# Patient Record
Sex: Female | Born: 1980 | Race: Black or African American | Hispanic: No | Marital: Single | Smoking: Current every day smoker
Health system: Southern US, Community
[De-identification: ages and names within clinical notes are randomized; demographics above are authoritative.]

## PROBLEM LIST (undated history)

## (undated) DIAGNOSIS — I10 Essential (primary) hypertension: Secondary | ICD-10-CM

---

## 2013-06-17 ENCOUNTER — Encounter (HOSPITAL_BASED_OUTPATIENT_CLINIC_OR_DEPARTMENT_OTHER): Payer: Self-pay | Admitting: Emergency Medicine

## 2013-06-17 ENCOUNTER — Emergency Department (HOSPITAL_BASED_OUTPATIENT_CLINIC_OR_DEPARTMENT_OTHER)
Admission: EM | Admit: 2013-06-17 | Discharge: 2013-06-17 | Discharge status: Against Medical Advice | Payer: Self-pay | Attending: Emergency Medicine | Admitting: Emergency Medicine

## 2013-06-17 ENCOUNTER — Emergency Department (HOSPITAL_BASED_OUTPATIENT_CLINIC_OR_DEPARTMENT_OTHER): Payer: Self-pay

## 2013-06-17 ENCOUNTER — Emergency Department (HOSPITAL_BASED_OUTPATIENT_CLINIC_OR_DEPARTMENT_OTHER)
Admission: EM | Admit: 2013-06-17 | Discharge: 2013-06-17 | Disposition: A | Discharge status: Home / Self Care | Payer: Self-pay | Attending: Emergency Medicine | Admitting: Emergency Medicine

## 2013-06-17 DIAGNOSIS — R51 Headache: Secondary | ICD-10-CM | POA: Insufficient documentation

## 2013-06-17 DIAGNOSIS — N390 Urinary tract infection, site not specified: Secondary | ICD-10-CM | POA: Insufficient documentation

## 2013-06-17 DIAGNOSIS — R350 Frequency of micturition: Secondary | ICD-10-CM | POA: Insufficient documentation

## 2013-06-17 DIAGNOSIS — O2390 Unspecified genitourinary tract infection in pregnancy, unspecified trimester: Secondary | ICD-10-CM | POA: Insufficient documentation

## 2013-06-17 DIAGNOSIS — O99330 Smoking (tobacco) complicating pregnancy, unspecified trimester: Secondary | ICD-10-CM | POA: Insufficient documentation

## 2013-06-17 DIAGNOSIS — Z3490 Encounter for supervision of normal pregnancy, unspecified, unspecified trimester: Secondary | ICD-10-CM

## 2013-06-17 DIAGNOSIS — R109 Unspecified abdominal pain: Secondary | ICD-10-CM | POA: Insufficient documentation

## 2013-06-17 LAB — CBC WITH DIFFERENTIAL/PLATELET
Basophils Absolute: 0 10*3/uL (ref 0.0–0.1)
Basophils Relative: 0 % (ref 0–1)
EOS ABS: 0.1 10*3/uL (ref 0.0–0.7)
Eosinophils Relative: 1 % (ref 0–5)
HCT: 33.8 % — ABNORMAL LOW (ref 36.0–46.0)
HEMOGLOBIN: 11.3 g/dL — AB (ref 12.0–15.0)
LYMPHS ABS: 3.2 10*3/uL (ref 0.7–4.0)
LYMPHS PCT: 37 % (ref 12–46)
MCH: 28 pg (ref 26.0–34.0)
MCHC: 33.4 g/dL (ref 30.0–36.0)
MCV: 83.7 fL (ref 78.0–100.0)
MONOS PCT: 8 % (ref 3–12)
Monocytes Absolute: 0.7 10*3/uL (ref 0.1–1.0)
NEUTROS PCT: 54 % (ref 43–77)
Neutro Abs: 4.7 10*3/uL (ref 1.7–7.7)
Platelets: 281 10*3/uL (ref 150–400)
RBC: 4.04 MIL/uL (ref 3.87–5.11)
RDW: 13.8 % (ref 11.5–15.5)
WBC: 8.6 10*3/uL (ref 4.0–10.5)

## 2013-06-17 LAB — PREGNANCY, URINE: PREG TEST UR: POSITIVE — AB

## 2013-06-17 LAB — URINALYSIS, ROUTINE W REFLEX MICROSCOPIC
Bilirubin Urine: NEGATIVE
Glucose, UA: NEGATIVE mg/dL
HGB URINE DIPSTICK: NEGATIVE
Ketones, ur: NEGATIVE mg/dL
Nitrite: POSITIVE — AB
PROTEIN: NEGATIVE mg/dL
Specific Gravity, Urine: 1.019 (ref 1.005–1.030)
Urobilinogen, UA: 1 mg/dL (ref 0.0–1.0)
pH: 7 (ref 5.0–8.0)

## 2013-06-17 LAB — ABO/RH: ABO/RH(D): O POS

## 2013-06-17 LAB — URINE MICROSCOPIC-ADD ON

## 2013-06-17 LAB — HCG, QUANTITATIVE, PREGNANCY: HCG, BETA CHAIN, QUANT, S: 40120 m[IU]/mL — AB (ref <5)

## 2013-06-17 MED ORDER — CEPHALEXIN 500 MG PO CAPS: 500.0000 mg | ORAL_CAPSULE | Freq: Four times a day (QID) | ORAL | Status: AC

## 2013-06-17 NOTE — ED Notes (Signed)
Pt states she has to pick up her sister by 8pm, she can't stay for US or blood work; pt states she will come back afterwards. EDP notified; pt encouraged to stay, no distress noted

## 2013-06-17 NOTE — ED Notes (Signed)
Pt c/o urinary freq and left flank pain

## 2013-06-17 NOTE — ED Provider Notes (Signed)
CSN: [id number]     Arrival date & time 06/17/13  2020 History   First MD Initiated Contact with Patient 06/17/13 2029     Chief Complaint  Patient presents with  . Abdominal Pain     (Consider location/radiation/quality/duration/timing/severity/associated sxs/prior Treatment) HPI Comments: Patient was here Approximately an hour ago and left to pick up her sister. She had signed out AMA after I told her she was pregnant and wanted to rule out an ectopic pregnancy. She returns for completion of her workup.  Patient is a 33 y.o. female presenting with abdominal pain. The history is provided by the patient.  Abdominal Pain Pain location:  L flank Pain quality: cramping   Pain radiates to:  Does not radiate Pain severity:  Moderate Onset quality:  Gradual Duration:  2 days Timing:  Constant Progression:  Worsening Chronicity:  New   History reviewed. No pertinent past medical history. Past Surgical History  Procedure Laterality Date  . Cesarean section     History reviewed. No pertinent family history. History  Substance Use Topics  . Smoking status: Current Every Day Smoker -- 0.50 packs/day    Types: Cigarettes  . Smokeless tobacco: Not on file  . Alcohol Use: No   OB History   Grav Para Term Preterm Abortions TAB SAB Ect Mult Living                 Review of Systems  Gastrointestinal: Positive for abdominal pain.  All other systems reviewed and are negative.     Allergies  Review of patient's allergies indicates no known allergies.  Home Medications   Prior to Admission medications   Medication Sig Start Date End Date Taking? Authorizing Provider  cephALEXin (KEFLEX) 500 MG capsule Take 1 capsule (500 mg total) by mouth 4 (four) times daily. 06/17/13   Geoffery Lyons, MD   BP 138/85  Pulse 85  Temp(Src) 99.3 F (37.4 C) (Oral)  Resp 16  SpO2 100%  LMP 04/30/2013 Physical Exam  Nursing note and vitals reviewed. Constitutional: She is oriented to person,  place, and time. She appears well-developed and well-nourished. No distress.  HENT:  Head: Normocephalic and atraumatic.  Neck: Normal range of motion. Neck supple.  Cardiovascular: Normal rate and regular rhythm.  Exam reveals no gallop and no friction rub.   No murmur heard. Pulmonary/Chest: Effort normal and breath sounds normal. No respiratory distress. She has no wheezes.  Abdominal: Soft. Bowel sounds are normal. She exhibits no distension. There is no tenderness.  Musculoskeletal: Normal range of motion.  Neurological: She is alert and oriented to person, place, and time.  Skin: Skin is warm and dry. She is not diaphoretic.    ED Course  Procedures (including critical care time) Labs Review Labs Reviewed  CBC WITH DIFFERENTIAL - Abnormal; Notable for the following:    Hemoglobin 11.3 (*)    HCT 33.8 (*)    All other components within normal limits  HCG, QUANTITATIVE, PREGNANCY - Abnormal; Notable for the following:    hCG, Beta Chain, Quant, S 40120 (*)    All other components within normal limits  ABO/RH    Imaging Review US Ob Comp Less 14 Wks  06/17/2013   CLINICAL DATA:  Increased urinary frequency. Left pelvic and flank pain.  EXAM: OBSTETRIC <14 WK [place] AND TRANSVAGINAL OB US  TECHNIQUE: Both transabdominal and transvaginal ultrasound examinations were performed for complete evaluation of the gestation as well as the maternal uterus, adnexal regions, and pelvic cul-de-sac.  Transvaginal technique was performed to assess early pregnancy.  COMPARISON:  None.  FINDINGS: Intrauterine gestational sac: Visualized/normal in shape.  Yolk sac:  Yes  Embryo:  Yes  Cardiac Activity: Yes  Heart Rate: 168 bpm  CRL:   4.2  cm   11 w 0 d                  US EDC: 01/06/2014  Maternal uterus/adnexae: No significant subchorionic hemorrhage is seen. There is question of an amniotic band. The uterus is otherwise unremarkable in appearance.  The ovaries are unremarkable in appearance. The right  ovary measures 3.1 x 2.5 x 2.0 cm, while the left ovary measures 3.8 x 2.0 x 2.3 cm. No suspicious adnexal masses are seen; there is no evidence for ovarian torsion.  No free fluid is seen within the pelvic cul-de-sac.  IMPRESSION: 1. Single live intrauterine pregnancy noted, with a crown-rump length of 4.2 cm, corresponding to a gestational age of [redacted] weeks 0 days. This does not match the gestational age by LMP, and reflects a new estimated date of delivery of January 06, 2014. 2. Question of an amniotic band. Follow-up complete OB ultrasound would be indicated as the fetus grows, to better assess this potential finding.   Electronically Signed   By: Roanna RaiderJeffery  Chang M.D.   On: 06/17/2013 22:35   [place] Ob Transvaginal  06/17/2013   CLINICAL DATA:  Increased urinary frequency. Left pelvic and flank pain.  EXAM: OBSTETRIC <14 WK US AND TRANSVAGINAL OB US  TECHNIQUE: Both transabdominal and transvaginal ultrasound examinations were performed for complete evaluation of the gestation as well as the maternal uterus, adnexal regions, and pelvic cul-de-sac. Transvaginal technique was performed to assess early pregnancy.  COMPARISON:  None.  FINDINGS: Intrauterine gestational sac: Visualized/normal in shape.  Yolk sac:  Yes  Embryo:  Yes  Cardiac Activity: Yes  Heart Rate: 168 bpm  CRL:   4.2  cm   11 w 0 d                  US EDC: 01/06/2014  Maternal uterus/adnexae: No significant subchorionic hemorrhage is seen. There is question of an amniotic band. The uterus is otherwise unremarkable in appearance.  The ovaries are unremarkable in appearance. The right ovary measures 3.1 x 2.5 x 2.0 cm, while the left ovary measures 3.8 x 2.0 x 2.3 cm. No suspicious adnexal masses are seen; there is no evidence for ovarian torsion.  No free fluid is seen within the pelvic cul-de-sac.  IMPRESSION: 1. Single live intrauterine pregnancy noted, with a crown-rump length of 4.2 cm, corresponding to a gestational age of [redacted] weeks 0 days. This  does not match the gestational age by LMP, and reflects a new estimated date of delivery of January 06, 2014. 2. Question of an amniotic band. Follow-up complete OB ultrasound would be indicated as the fetus grows, to better assess this potential finding.   Electronically Signed   By: Roanna RaiderJeffery  Chang M.D.   On: 06/17/2013 22:35     EKG Interpretation None      MDM   Final diagnoses:  UTI (urinary tract infection)  Pregnant    Beta hCG and ultrasound are consistent with an [redacted] week gestation. There is no ectopic pregnancy and no other acute abnormality. She is feeling better and is requesting discharge. She will be prescribed Keflex and understands to return if she develops vaginal bleeding, increasing pain, high fever, vomiting, or any other new and concerning  symptoms.     [name] Shaurya Rawdon, MD 06/17/13 [phone]

## 2013-06-17 NOTE — ED Notes (Signed)
Seen here earlier for same

## 2013-06-17 NOTE — Discharge Instructions (Signed)
Keflex as prescribed.  Followup with OB for further care.   Urinary Tract Infection Urinary tract infections (UTIs) can develop anywhere along your urinary tract. Your urinary tract is your body's drainage system for removing wastes and extra water. Your urinary tract includes two kidneys, two ureters, a bladder, and a urethra. Your kidneys are a pair of bean-shaped organs. Each kidney is about the size of your fist. They are located below your ribs, one on each side of your spine. CAUSES Infections are caused by microbes, which are microscopic organisms, including fungi, viruses, and bacteria. These organisms are so small that they can only be seen through a microscope. Bacteria are the microbes that most commonly cause UTIs. SYMPTOMS  Symptoms of UTIs may vary by age and gender of the patient and by the location of the infection. Symptoms in young women typically include a frequent and intense urge to urinate and a painful, burning feeling in the bladder or urethra during urination. Older women and men are more likely to be tired, shaky, and weak and have muscle aches and abdominal pain. A fever may mean the infection is in your kidneys. Other symptoms of a kidney infection include pain in your back or sides below the ribs, nausea, and vomiting. DIAGNOSIS To diagnose a UTI, your caregiver will ask you about your symptoms. Your caregiver also will ask to provide a urine sample. The urine sample will be tested for bacteria and white blood cells. White blood cells are made by your body to help fight infection. TREATMENT  Typically, UTIs can be treated with medication. Because most UTIs are caused by a bacterial infection, they usually can be treated with the use of antibiotics. The choice of antibiotic and length of treatment depend on your symptoms and the type of bacteria causing your infection. HOME CARE INSTRUCTIONS  If you were prescribed antibiotics, take them exactly as your caregiver  instructs you. Finish the medication even if you feel better after you have only taken some of the medication.  Drink enough water and fluids to keep your urine clear or pale yellow.  Avoid caffeine, tea, and carbonated beverages. They tend to irritate your bladder.  Empty your bladder often. Avoid holding urine for long periods of time.  Empty your bladder before and after sexual intercourse.  After a bowel movement, women should cleanse from front to back. Use each tissue only once. SEEK MEDICAL CARE IF:   You have back pain.  You develop a fever.  Your symptoms do not begin to resolve within 3 days. SEEK IMMEDIATE MEDICAL CARE IF:   You have severe back pain or lower abdominal pain.  You develop chills.  You have nausea or vomiting.  You have continued burning or discomfort with urination. MAKE SURE YOU:   Understand these instructions.  Will watch your condition.  Will get help right away if you are not doing well or get worse. Document Released: 11/15/2004 Document Revised: 08/07/2011 Document Reviewed: 03/16/2011 [facility] Patient Information 2014 [place], [place].  Pregnancy If you are planning on getting pregnant, it is a good idea to make a preconception appointment with your caregiver to discuss having a healthy lifestyle before getting pregnant. This includes diet, weight, exercise, taking prenatal vitamins (especially folic acid, which helps prevent brain and spinal cord defects), avoiding alcohol, smoking and illegal drugs, medical problems (diabetes, convulsions), family history of genetic problems, working conditions, and immunizations. It is better to have knowledge of these things and do something about them before  getting pregnant. During your pregnancy, it is important to follow certain guidelines in order to have a healthy baby. It is very important to get good prenatal care and follow your caregiver's instructions. Prenatal care includes all the medical care  you receive before your baby's birth. This helps to prevent problems during the pregnancy and childbirth. HOME CARE INSTRUCTIONS   Start your prenatal visits by the 12th week of pregnancy or earlier, if possible. At first, appointments are usually scheduled monthly. They become more frequent in the last 2 months before delivery. It is important that you keep your caregiver's appointments and follow your caregiver's instructions regarding medication use, exercise, and diet.  During pregnancy, you are providing food for you and your baby. Eat a regular, well-balanced diet. Choose foods such as meat, fish, milk and other dairy products, vegetables, fruits, whole-grain breads and cereals. Your caregiver will inform you of the ideal weight gain depending on your current height and weight. Drink lots of liquids. Try to drink 8 glasses of water a day.  Alcohol is associated with a number of birth defects including fetal alcohol syndrome. It is best to avoid alcohol completely. Smoking will cause low birth rate and prematurity. Use of alcohol and nicotine during your pregnancy also increases the chances that your child will be chemically dependent later in their life and may contribute to SIDS (Sudden Infant Death Syndrome).  Do not use illegal drugs.  Only take prescription or over-the-counter medications that are recommended by your caregiver. Other medications can cause genetic and physical problems in the baby.  Morning sickness can often be helped by keeping soda crackers at the bedside. Eat a few before getting up in the morning.  A sexual relationship may be continued until near the end of pregnancy if there are no other problems such as early (premature) leaking of amniotic fluid from the membranes, vaginal bleeding, painful intercourse or belly (abdominal) pain.  Exercise regularly. Check with your caregiver if you are unsure of the safety of some of your exercises.  Do not use hot tubs, steam  rooms or saunas. These increase the risk of fainting and hurting yourself and the baby. Swimming is OK for exercise. Get plenty of rest, including afternoon naps when possible, especially in the third trimester.  Avoid toxic odors and chemicals.  Do not wear high heels. They may cause you to lose your balance and fall.  Do not lift over 5 pounds. If you do lift anything, lift with your legs and thighs, not your back.  Avoid long trips, especially in the third trimester.  If you have to travel out of the city or state, take a copy of your medical records with you. SEEK IMMEDIATE MEDICAL CARE IF:   You develop an unexplained oral temperature above 102 F (38.9 C), or as your caregiver suggests.  You have leaking of fluid from the vagina. If leaking membranes are suspected, take your temperature and inform your caregiver of this when you call.  There is vaginal spotting or bleeding. Notify your caregiver of the amount and how many pads are used.  You continue to feel sick to your stomach (nauseous) and have no relief from remedies suggested, or you throw up (vomit) blood or coffee ground like materials.  You develop upper abdominal pain.  You have round ligament discomfort in the lower abdominal area. This still must be evaluated by your caregiver.  You feel contractions of the uterus.  You do not feel the baby move,  or there is less movement than before.  You have painful urination.  You have abnormal vaginal discharge.  You have persistent diarrhea.  You get a severe headache.  You have problems with your vision.  You develop muscle weakness.  You feel dizzy and faint.  You develop shortness of breath.  You develop chest pain.  You have back pain that travels down to your leg and feet.  You feel irregular or a very fast heartbeat.  You develop excessive weight gain in a short period of time (5 pounds in 3 to 5 days).  You are involved in a domestic violence  situation. Document Released: 02/05/2005 Document Revised: 08/07/2011 Document Reviewed: 07/30/2008 [facility] Patient Information 2014 [place], [place].

## 2013-06-17 NOTE — ED Notes (Signed)
Pt c/o abd pain and ha,  Was seen here earlier this pm  haad to [place] and now has returned

## 2013-06-17 NOTE — ED Provider Notes (Signed)
CSN: [id number]     Arrival date & time 06/17/13  1846 History  This chart was scribed for Natalie Mckenzie [name], MD by Luisa DagoPriscilla Tutu, ED Scribe. This patient was seen in room MH05/MH05 and the patient's care was started at 7:18 PM.    Chief Complaint  Patient presents with  . Urinary Frequency    The history is provided by the patient. No language interpreter was used.   HPI Comments: Natalie Mckenzie is a 33 y.o. female who presents to the Emergency Department complaining of constant headaches that started . Pt is also complaining of left sided cramping and a "strong odor" to her urine. 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 Pt states that she no longer has normal periods since the birth of her son in 2013. She states that they had to do an emergency C-section and there were some complications that effected her uterus. Pt is sexually active. She denies any pertinent medical history. She denies any hematuria.  Pt's has type O blood. She does not recall if she is Rh positive or negative.   History reviewed. No pertinent past medical history. Past Surgical History  Procedure Laterality Date  . Cesarean section     History reviewed. No pertinent family history. History  Substance Use Topics  . Smoking status: Current Every Day Smoker -- 0.50 packs/day    Types: Cigarettes  . Smokeless tobacco: Not on file  . Alcohol Use: No   OB History   Grav Para Term Preterm Abortions TAB SAB Ect Mult Living                 Review of Systems A complete 10 system review of systems was obtained and all systems are negative except as noted in the HPI and PMH.     Allergies  Review of patient's allergies indicates no known allergies.  Home Medications   Prior to Admission medications   Not on File   BP 133/74  Pulse 77  Temp(Src) 98.9 F (37.2 C) (Oral)  Resp 16  Ht 5\' 4"  (1.626 m)  Wt 200 lb (90.719 kg)   BMI 34.31 kg/m2  SpO2 100%  LMP 04/30/2013  Physical Exam  Nursing note and vitals reviewed. Constitutional: She is oriented to person, place, and time. She appears well-developed and well-nourished. No distress.  HENT:  Head: Normocephalic and atraumatic.  Right Ear: External ear normal.  Left Ear: External ear normal.  Nose: Nose normal.  Mouth/Throat: Oropharynx is clear and moist. No oropharyngeal exudate.  Eyes: Conjunctivae and EOM are normal. Pupils are equal, round, and reactive to light. Right eye exhibits no discharge. Left eye exhibits no discharge.  Neck: Normal range of motion. Neck supple. No thyromegaly present.  Cardiovascular: Normal rate, regular rhythm, normal heart sounds and intact distal pulses.  Exam reveals no gallop and no friction rub.   No murmur heard. Pulmonary/Chest: Effort normal and breath sounds normal. No respiratory distress.  Abdominal: Soft. Bowel sounds are normal. She exhibits no distension and no mass. There is no tenderness. There is no rebound and no guarding.  Musculoskeletal: Normal range of motion. She exhibits no edema and no tenderness.  Lymphadenopathy:    She has no cervical adenopathy.  Neurological: She is alert and oriented to person, place, and time. She has normal reflexes. No cranial nerve deficit.  Skin: Skin is warm and dry.  Psychiatric: She  has a normal mood and affect. Her behavior is normal. Thought content normal.    ED Course  Procedures (including critical care time)  DIAGNOSTIC STUDIES: Oxygen Saturation is 100% on RA, normal by my interpretation.    COORDINATION OF CARE: 7:22 PM- Will order blood test. Will also order an ultrasound. Pt advised of plan for treatment and pt agrees.  Labs Review Labs Reviewed  URINALYSIS, ROUTINE W REFLEX MICROSCOPIC - Abnormal; Notable for the following:    APPearance CLOUDY (*)    Nitrite POSITIVE (*)    Leukocytes, UA LARGE (*)    All other components within normal limits   PREGNANCY, URINE - Abnormal; Notable for the following:    Preg Test, Ur POSITIVE (*)    All other components within normal limits  URINE MICROSCOPIC-ADD ON - Abnormal; Notable for the following:    Bacteria, UA FEW (*)    All other components within normal limits    Imaging Review No results found.   EKG Interpretation None      MDM   Final diagnoses:  None    Patient is a 33 year old female who presents with urinary frequency and left flank pain. Her abdominal exam is benign, however urinalysis reveals a UTI. Her pregnancy test was also found to be positive. My recommendations were to obtain a quantitative hCG and perform an ultrasound to rule out ectopic. Patient states that she could not stay for this exam and she had to pick up her sister but states she will return. She apparently signed out AMA but states that she will return for completion of the workup.  I personally performed the services described in this documentation, which was scribed in my presence. The recorded information has been reviewed and is accurate.    Natalie Mckenzie Natalie Galvis, MD 06/17/13 [id number]

## 2013-10-16 ENCOUNTER — Emergency Department (HOSPITAL_BASED_OUTPATIENT_CLINIC_OR_DEPARTMENT_OTHER)
Admission: EM | Admit: 2013-10-16 | Discharge: 2013-10-16 | Disposition: A | Discharge status: Home / Self Care | Payer: Self-pay | Attending: Emergency Medicine | Admitting: Emergency Medicine

## 2013-10-16 ENCOUNTER — Encounter (HOSPITAL_BASED_OUTPATIENT_CLINIC_OR_DEPARTMENT_OTHER): Payer: Self-pay | Admitting: Emergency Medicine

## 2013-10-16 DIAGNOSIS — F17200 Nicotine dependence, unspecified, uncomplicated: Secondary | ICD-10-CM | POA: Insufficient documentation

## 2013-10-16 DIAGNOSIS — R21 Rash and other nonspecific skin eruption: Secondary | ICD-10-CM | POA: Insufficient documentation

## 2013-10-16 MED ORDER — DIPHENHYDRAMINE HCL 25 MG PO CAPS: 25.0000 mg | ORAL_CAPSULE | Freq: Once | ORAL | Status: DC

## 2013-10-16 NOTE — Discharge Instructions (Signed)
Rash A rash is a change in the color or feel of your skin. There are many different types of rashes. You may have other problems along with your rash. HOME CARE  Avoid the thing that caused your rash.  Do not scratch your rash.  You may take cools baths to help stop itching.  Only take medicines as told by your doctor.  Keep all doctor visits as told. GET HELP RIGHT AWAY IF:   Your pain, puffiness (swelling), or redness gets worse.  You have a fever.  You have new or severe problems.  You have body aches, watery poop (diarrhea), or you throw up (vomit).  Your rash is not better after 3 days. MAKE SURE YOU:   Understand these instructions.  Will watch your condition.  Will get help right away if you are not doing well or get worse. Document Released: 07/25/2007 Document Revised: 04/30/2011 Document Reviewed: 11/20/2010 [employer] Patient Information 2015 [employer]. This information is not intended to replace advice given to you by your health care provider. Make sure you discuss any questions you have with your health care provider.  

## 2013-10-16 NOTE — ED Provider Notes (Signed)
CSN: [id number]     Arrival date & time 10/16/13  0932 History   First MD Initiated Contact with Patient 10/16/13 1020     Chief Complaint  Patient presents with  . Rash     (Consider location/radiation/quality/duration/timing/severity/associated sxs/prior Treatment) Patient is a 33 y.o. female presenting with rash. The history is provided by the patient.  Rash Location:  Torso Torso rash location:  L chest Quality comment:  Raised bumps Severity:  Mild Onset quality:  Gradual Timing:  Constant Progression:  Improving Chronicity:  New Context: not animal contact, not chemical exposure, not diapers, not eggs, not food, not hot tub use, not insect bite/sting, not medications, not new detergent/soap and not nuts   Worsened by:  Nothing tried Ineffective treatments:  None tried Associated symptoms: no abdominal pain, no diarrhea, no fatigue, no fever, no headaches and no hoarse voice     History reviewed. No pertinent past medical history. Past Surgical History  Procedure Laterality Date  . Cesarean section     No family history on file. History  Substance Use Topics  . Smoking status: Current Every Day Smoker -- 0.50 packs/day    Types: Cigarettes  . Smokeless tobacco: Not on file  . Alcohol Use: No   OB History   Grav Para Term Preterm Abortions TAB SAB Ect Mult Living   1              Review of Systems  Constitutional: Negative for fever and fatigue.  HENT: Negative for hoarse voice.   Gastrointestinal: Negative for abdominal pain and diarrhea.  Skin: Positive for rash.  Neurological: Negative for headaches.  All other systems reviewed and are negative.     Allergies  Review of patient's allergies indicates no known allergies.  Home Medications   Prior to Admission medications   Medication Sig Start Date End Date Taking? Authorizing Provider  cephALEXin (KEFLEX) 500 MG capsule Take 1 capsule (500 mg total) by mouth 4 (four) times daily. 06/17/13   Geoffery Lyons, MD   BP 140/83  Pulse 73  Temp(Src) 98.2 F (36.8 C) (Oral)  Resp 16  Ht  (1.626 m)  Wt 197 lb (89.359 kg)  BMI 33.80 kg/m2  SpO2 100%  LMP 04/30/2013 Physical Exam  Nursing note and vitals reviewed. Constitutional: She appears well-developed and well-nourished.  HENT:  Head: Normocephalic and atraumatic.  Mouth/Throat: Oropharynx is clear and moist.  Eyes: Conjunctivae and EOM are normal. Pupils are equal, round, and reactive to light.  Neck: Normal range of motion. Neck supple.  Cardiovascular: Normal rate and regular rhythm.   Pulmonary/Chest: Effort normal.  Abdominal: Soft.  Skin: Rash noted. Rash is maculopapular.       ED Course  Procedures (including critical care time)  MDM   Final diagnoses:  Rash        Hilario Quarry, MD 10/16/13 1027

## 2013-10-16 NOTE — ED Notes (Signed)
Pt states to chest for over one week. Pt does remember changing bodywashes. Denies other symptoms.

## 2013-12-21 ENCOUNTER — Encounter (HOSPITAL_BASED_OUTPATIENT_CLINIC_OR_DEPARTMENT_OTHER): Payer: Self-pay | Admitting: Emergency Medicine

## 2014-04-02 ENCOUNTER — Encounter (HOSPITAL_BASED_OUTPATIENT_CLINIC_OR_DEPARTMENT_OTHER): Payer: Self-pay

## 2014-04-02 ENCOUNTER — Emergency Department (HOSPITAL_BASED_OUTPATIENT_CLINIC_OR_DEPARTMENT_OTHER)
Admission: EM | Admit: 2014-04-02 | Discharge: 2014-04-02 | Disposition: A | Discharge status: Home / Self Care | Payer: Medicaid Other | Attending: Emergency Medicine | Admitting: Emergency Medicine

## 2014-04-02 ENCOUNTER — Emergency Department (HOSPITAL_BASED_OUTPATIENT_CLINIC_OR_DEPARTMENT_OTHER): Payer: Medicaid Other

## 2014-04-02 DIAGNOSIS — W1830XA Fall on same level, unspecified, initial encounter: Secondary | ICD-10-CM | POA: Diagnosis not present

## 2014-04-02 DIAGNOSIS — Z792 Long term (current) use of antibiotics: Secondary | ICD-10-CM | POA: Insufficient documentation

## 2014-04-02 DIAGNOSIS — S93602A Unspecified sprain of left foot, initial encounter: Secondary | ICD-10-CM | POA: Insufficient documentation

## 2014-04-02 DIAGNOSIS — I10 Essential (primary) hypertension: Secondary | ICD-10-CM | POA: Diagnosis not present

## 2014-04-02 DIAGNOSIS — S99922A Unspecified injury of left foot, initial encounter: Secondary | ICD-10-CM | POA: Diagnosis present

## 2014-04-02 DIAGNOSIS — Z720 Tobacco use: Secondary | ICD-10-CM | POA: Diagnosis not present

## 2014-04-02 DIAGNOSIS — Y9289 Other specified places as the place of occurrence of the external cause: Secondary | ICD-10-CM | POA: Insufficient documentation

## 2014-04-02 DIAGNOSIS — Y998 Other external cause status: Secondary | ICD-10-CM | POA: Insufficient documentation

## 2014-04-02 DIAGNOSIS — Y9389 Activity, other specified: Secondary | ICD-10-CM | POA: Insufficient documentation

## 2014-04-02 HISTORY — DX: Essential (primary) hypertension: I10

## 2014-04-02 MED ORDER — IBUPROFEN 600 MG PO TABS: 600.0000 mg | ORAL_TABLET | Freq: Four times a day (QID) | ORAL | Status: AC | PRN

## 2014-04-02 NOTE — ED Notes (Signed)
Pt reports left foot pain after falling 3 ft from a porch today. Also reports edema.

## 2014-04-02 NOTE — Discharge Instructions (Signed)
Ibuprofen for pain. Keep foot elevated. Ice. Follow up with your doctor for recheck. Crutches for 1-2 days as needed. Xray is normal today.    Foot Sprain The muscles and cord like structures which attach muscle to bone (tendons) that surround the feet are made up of units. A foot sprain can occur at the weakest spot in any of these units. This condition is most often caused by injury to or overuse of the foot, as from playing contact sports, or aggravating a previous injury, or from poor conditioning, or obesity. SYMPTOMS  Pain with movement of the foot.  Tenderness and swelling at the injury site.  Loss of strength is present in moderate or severe sprains. THE THREE GRADES OR SEVERITY OF FOOT SPRAIN ARE:  Mild (Grade I): Slightly pulled muscle without tearing of muscle or tendon fibers or loss of strength.  Moderate (Grade II): Tearing of fibers in a muscle, tendon, or at the attachment to bone, with small decrease in strength.  Severe (Grade III): Rupture of the muscle-tendon-bone attachment, with separation of fibers. Severe sprain requires surgical repair. Often repeating (chronic) sprains are caused by overuse. Sudden (acute) sprains are caused by direct injury or over-use. DIAGNOSIS  Diagnosis of this condition is usually by your own observation. If problems continue, a caregiver may be required for further evaluation and treatment. X-rays may be required to make sure there are not breaks in the bones (fractures) present. Continued problems may require physical therapy for treatment. PREVENTION  Use strength and conditioning exercises appropriate for your sport.  Warm up properly prior to working out.  Use athletic shoes that are made for the sport you are participating in.  Allow adequate time for healing. Early return to activities makes repeat injury more likely, and can lead to an unstable arthritic foot that can result in prolonged disability. Mild sprains generally heal in 3  to 10 days, with moderate and severe sprains taking 2 to 10 weeks. Your caregiver can help you determine the proper time required for healing. HOME CARE INSTRUCTIONS   Apply ice to the injury for 15-20 minutes, 03-04 times per day. Put the ice in a plastic bag and place a towel between the bag of ice and your skin.  An elastic wrap (like an Ace bandage) may be used to keep swelling down.  Keep foot above the level of the heart, or at least raised on a footstool, when swelling and pain are present.  Try to avoid use other than gentle range of motion while the foot is painful. Do not resume use until instructed by your caregiver. Then begin use gradually, not increasing use to the point of pain. If pain does develop, decrease use and continue the above measures, gradually increasing activities that do not cause discomfort, until you gradually achieve normal use.  Use crutches if and as instructed, and for the length of time instructed.  Keep injured foot and ankle wrapped between treatments.  Massage foot and ankle for comfort and to keep swelling down. Massage from the toes up towards the knee.  Only take over-the-counter or prescription medicines for pain, discomfort, or fever as directed by your caregiver. SEEK IMMEDIATE MEDICAL CARE IF:   Your pain and swelling increase, or pain is not controlled with medications.  You have loss of feeling in your foot or your foot turns cold or blue.  You develop new, unexplained symptoms, or an increase of the symptoms that brought you to your caregiver. MAKE SURE YOU:  Understand these instructions.  Will watch your condition.  Will get help right away if you are not doing well or get worse. Document Released: 07/28/2001 Document Revised: 04/30/2011 Document Reviewed: 09/25/2007 [facility] Patient Information 2015 [place], [place]. This information is not intended to replace advice given to you by your health care provider. Make sure you discuss  any questions you have with your health care provider.

## 2014-04-02 NOTE — ED Provider Notes (Signed)
CSN: [id number]     Arrival date & time 04/02/14  1934 History   First MD Initiated Contact with Patient 04/02/14 1947     Chief Complaint  Patient presents with  . Foot Injury     (Consider location/radiation/quality/duration/timing/severity/associated sxs/prior Treatment) HPI Ajwa Corron is a 34 y.o. female with hx of htn, presents to ED with complaint of a foot injury. States she fell off of her porch today, approximately 3 ft, states turned her foot. Pain and swelling to the foot since then. States foot is tender to palpation and when applying pressure. States she is unable to walk on it. Did not ice or take anything for pain prior to coming in. No other injuries.   Past Medical History  Diagnosis Date  . Hypertension    Past Surgical History  Procedure Laterality Date  . Cesarean section     History reviewed. No pertinent family history. History  Substance Use Topics  . Smoking status: Current Every Day Smoker -- 0.50 packs/day    Types: Cigarettes  . Smokeless tobacco: Not on file  . Alcohol Use: No   OB History    Gravida Para Term Preterm AB TAB SAB Ectopic Multiple Living   1              Review of Systems  Constitutional: Negative for fever and chills.  Respiratory: Negative for cough, chest tightness and shortness of breath.   Cardiovascular: Negative for chest pain, palpitations and leg swelling.  Musculoskeletal: Positive for joint swelling and arthralgias. Negative for neck pain and neck stiffness.  Skin: Negative for rash.  Neurological: Negative for dizziness.  All other systems reviewed and are negative.     Allergies  Review of patient's allergies indicates no known allergies.  Home Medications   Prior to Admission medications   Medication Sig Start Date End Date Taking? Authorizing Provider  cephALEXin (KEFLEX) 500 MG capsule Take 1 capsule (500 mg total) by mouth 4 (four) times daily. 06/17/13   Geoffery Lyons, MD   BP 161/91 mmHg  Pulse 66   Temp(Src) 98.7 F (37.1 C) (Oral)  Resp 18  Ht  (1.626 m)  Wt 170 lb (77.111 kg)  BMI 29.17 kg/m2  SpO2 99%  LMP 03/26/2014  Breastfeeding? Unknown Physical Exam  Constitutional: She is oriented to person, place, and time. She appears well-developed and well-nourished. No distress.  HENT:  Head: Normocephalic.  Eyes: Conjunctivae are normal.  Neck: Neck supple.  Cardiovascular: Normal rate, regular rhythm and normal heart sounds.   Pulmonary/Chest: Effort normal and breath sounds normal. No respiratory distress. She has no wheezes. She has no rales.  Musculoskeletal: She exhibits no edema.  Mild swelling to the dorsal foot. ttp to the 3rd, 4th, 5th metatarsals. Full rom of all toes. Normal ankle with no ttp and full rom.   Neurological: She is alert and oriented to person, place, and time.  Skin: Skin is warm and dry.  Psychiatric: She has a normal mood and affect. Her behavior is normal.  Nursing note and vitals reviewed.   ED Course  Procedures (including critical care time) Labs Review Labs Reviewed - No data to display  Imaging Review Dg Foot Complete Left  04/02/2014   CLINICAL DATA:  Pain following fall  EXAM: LEFT FOOT - COMPLETE 3+ VIEW  COMPARISON:  None.  FINDINGS: Frontal, oblique, and lateral views were obtained. There is no demonstrable fracture or dislocation. Joint spaces appear intact. No erosive change.  IMPRESSION: No  fracture or dislocation.  Joint spaces appear intact.   Electronically Signed   By: Bretta BangWilliam  Woodruff III M.D.   On: 04/02/2014 20:53     EKG Interpretation None      MDM   Final diagnoses:  Foot sprain, left, initial encounter    Pt with left foot injury after a fall. Xray negative. Joints stable. Neurovascularly intact. Most likely a sprain based on hx and pe. Home with ace wrap, crutches, ice, elevation, ibuprofen, follow up with pcp.   Filed Vitals:   04/02/14 1937 04/02/14 2106  BP: 161/91 148/100  Pulse: 66 60  Temp: 98.7  F (37.1 C)   TempSrc: Oral   Resp: 18   Height: 5\' 4"  (1.626 m)   Weight: 170 lb (77.111 kg)   SpO2: 99% 99%       Lottie Musselatyana A Chancellor Vanderloop, PA-C 04/02/14 2110  Gwyneth SproutWhitney Plunkett, MD 04/02/14 2255

## 2016-01-29 IMAGING — US US OB TRANSVAGINAL
1 series · 13 of 28 positions shown · non-contrast
Comparison: None.

CLINICAL DATA: Increased urinary frequency. Left pelvic and flank
pain.

EXAM:
OBSTETRIC <14 WK US AND TRANSVAGINAL OB US
TECHNIQUE: Both transabdominal and transvaginal ultrasound examinations were
performed for complete evaluation of the gestation as well as the
maternal uterus, adnexal regions, and pelvic cul-de-sac.
Transvaginal technique was performed to assess early pregnancy.

[Series 1: us ob transvaginal · 0.30mm/px · 13 of 37 slices shown]
[im 2/37]
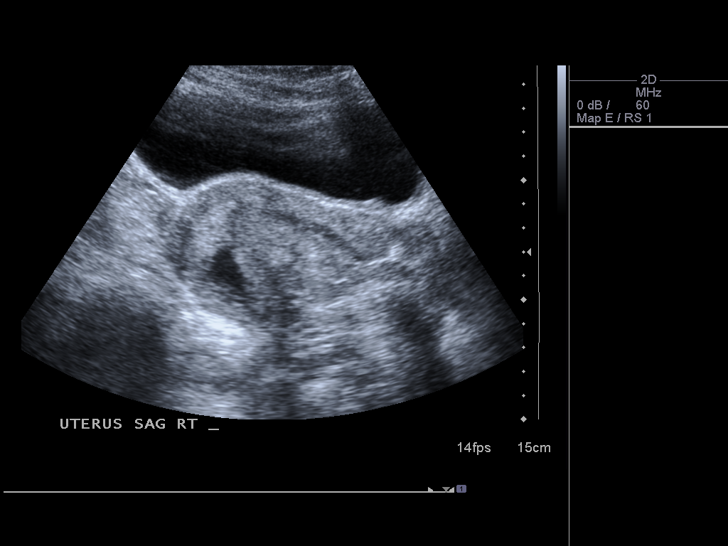
[im 5/37]
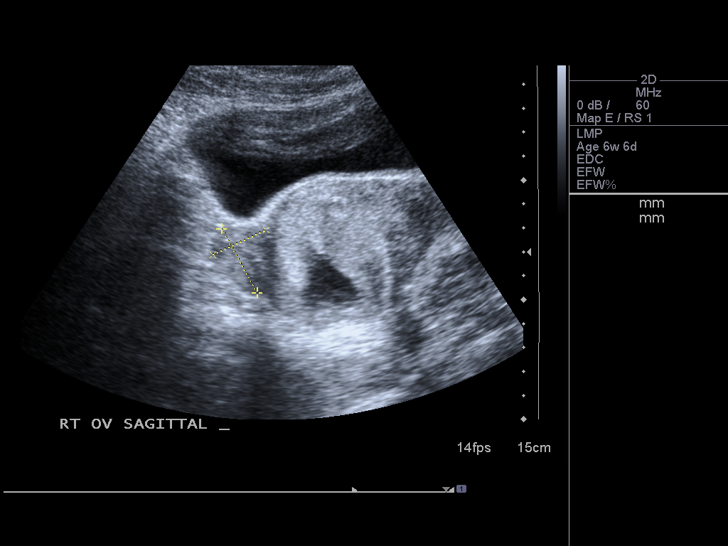
[im 7/37]
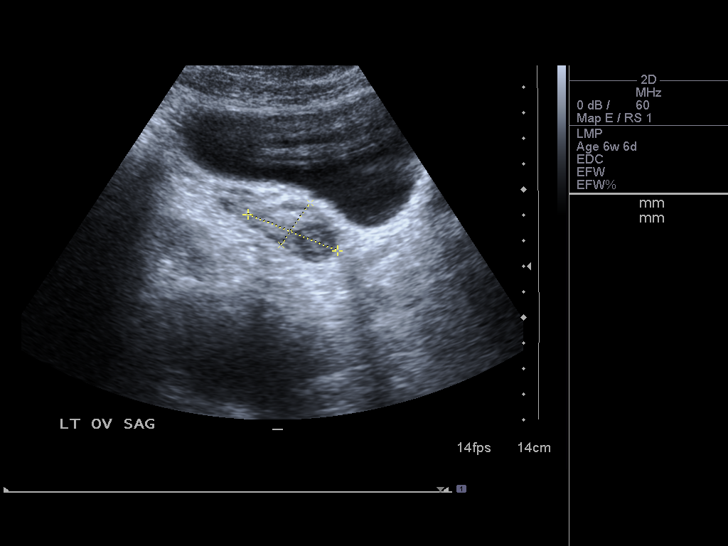
[im 10/37]
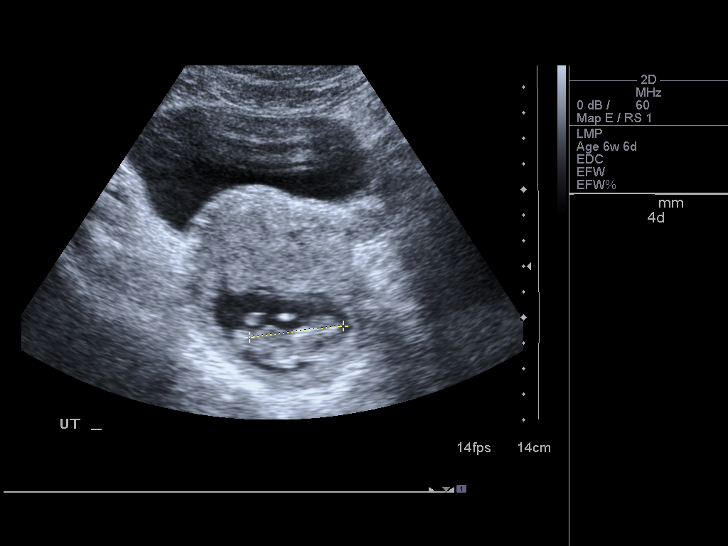
[im 13/37]
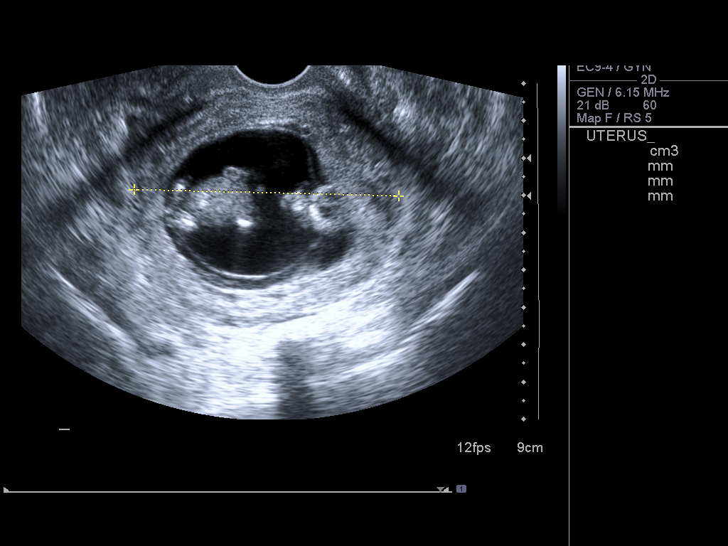
[im 15/37]
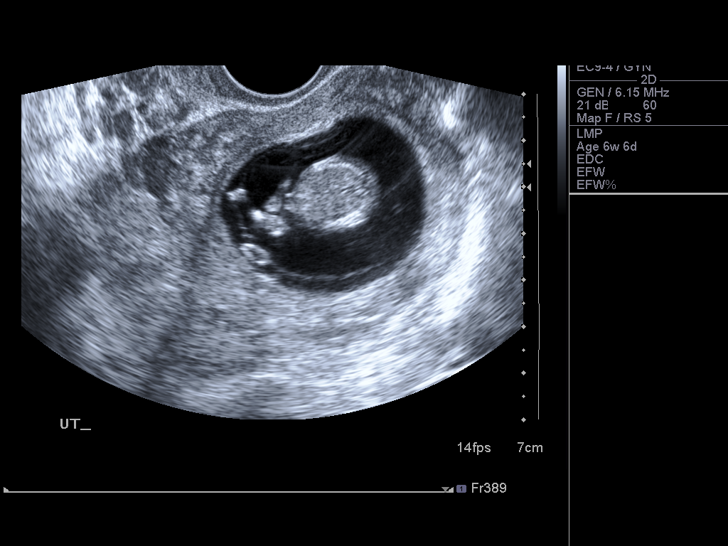
[im 19/37]
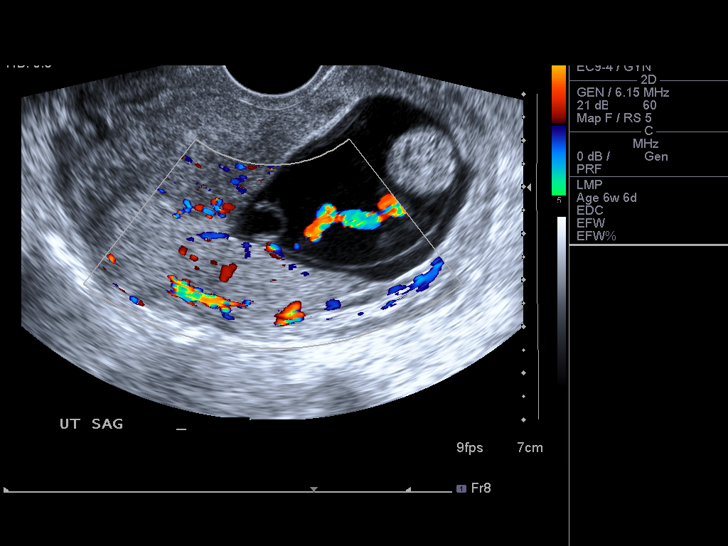
[im 22/37]
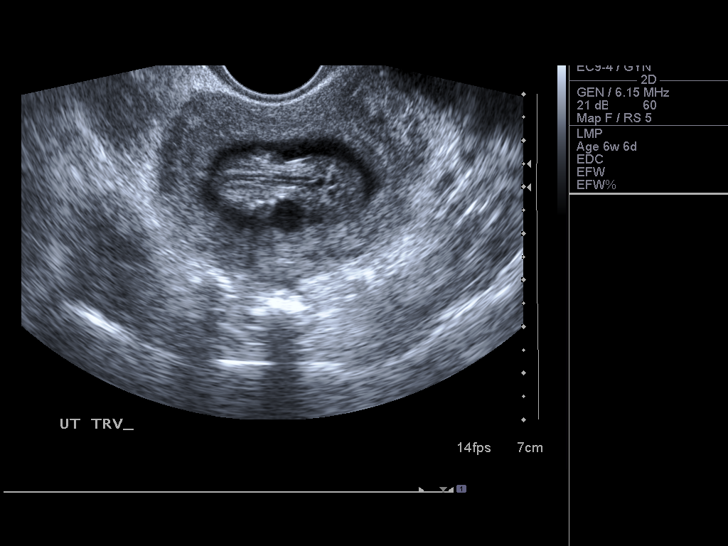
[im 25/37]
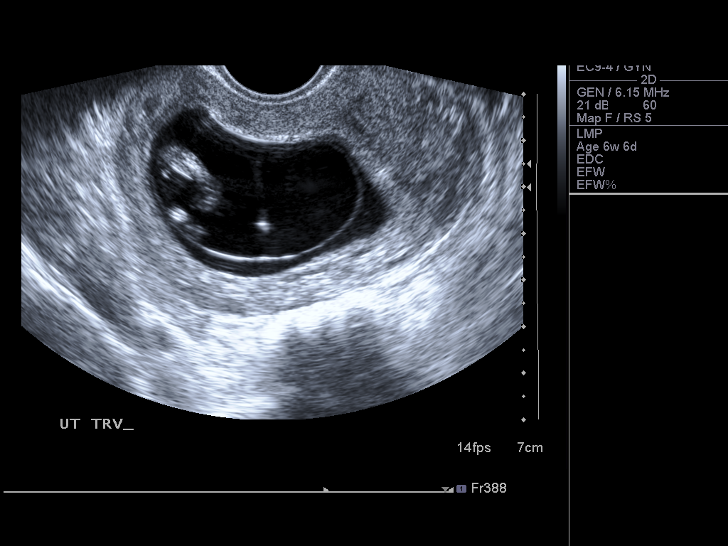
[im 27/37]
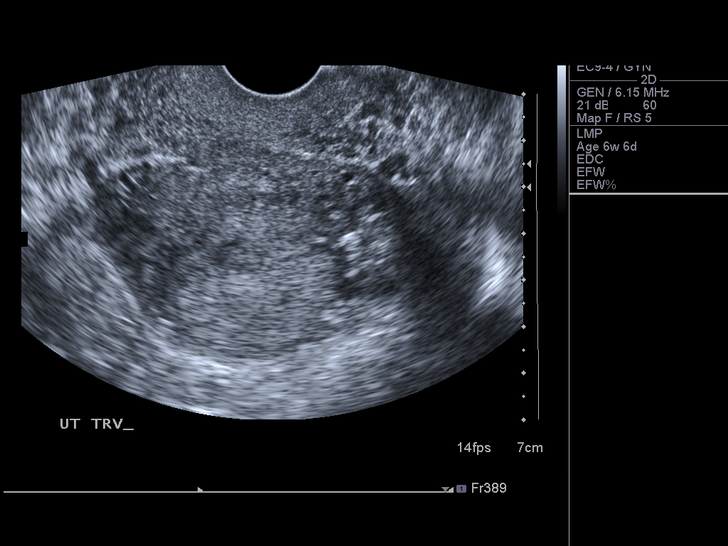
[im 30/37]
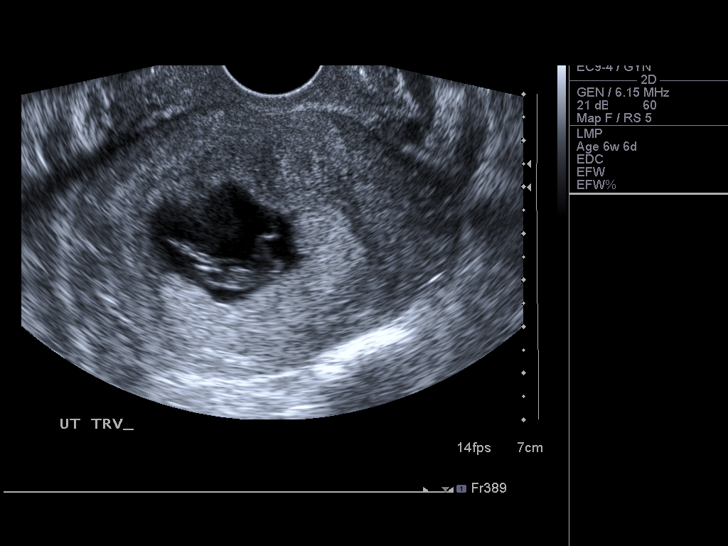
[im 33/37]
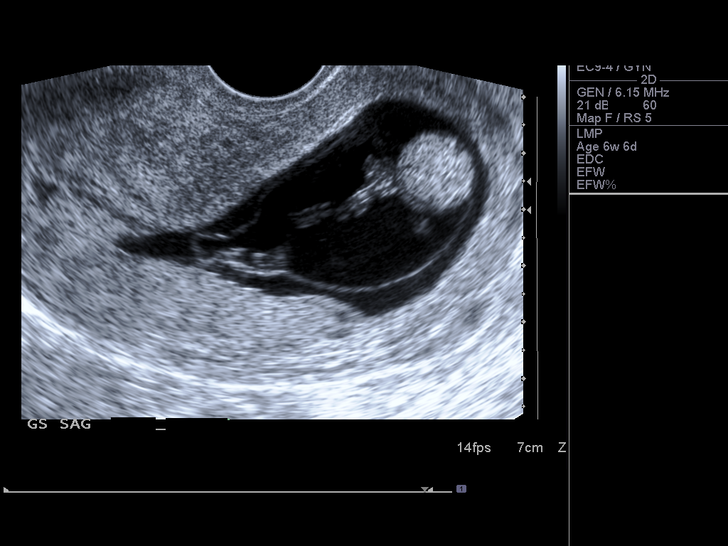
[im 35/37]
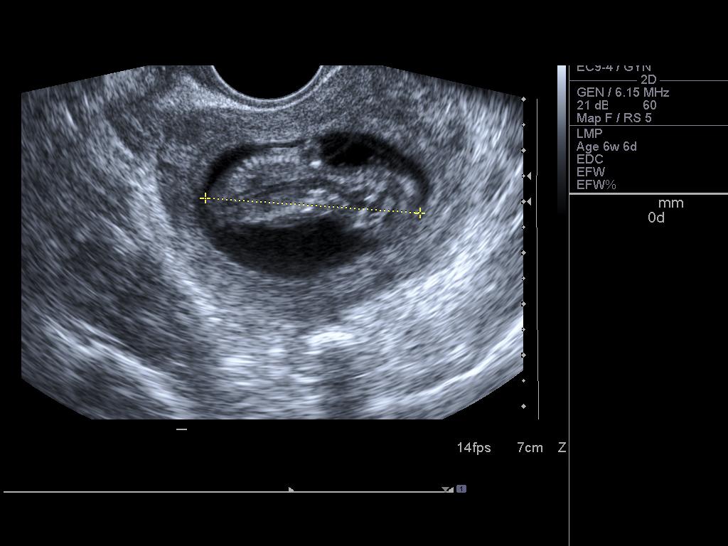

[13 of 28 positions shown; findings below may reference images not displayed]

FINDINGS: Intrauterine gestational sac: Visualized/normal in shape.

Yolk sac:  Yes

Embryo:  Yes

Cardiac Activity: Yes

Heart Rate: 168 bpm

CRL:   4.2  cm   11 w 0 d                  US EDC: 01/06/2014

Maternal uterus/adnexae: No significant subchorionic hemorrhage is
seen. There is question of an amniotic band. The uterus is otherwise
unremarkable in appearance.

The ovaries are unremarkable in appearance. The right ovary measures
3.1 x 2.5 x 2.0 cm, while the left ovary measures 3.8 x 2.0 x
cm. No suspicious adnexal masses are seen; there is no evidence for
ovarian torsion.

No free fluid is seen within the pelvic cul-de-sac.
IMPRESSION: 1. Single live intrauterine pregnancy noted, with a crown-rump
length of 4.2 cm, corresponding to a gestational age of 11 weeks 0
days. This does not match the gestational age by LMP, and reflects a
new estimated date of delivery January 06, 2014.
2. Question of an amniotic band. Follow-up complete OB ultrasound
would be indicated as the fetus grows, to better assess this
potential finding.

## 2016-11-13 IMAGING — DX DG FOOT COMPLETE 3+V*L*
3 series · 3 of 3 positions shown · non-contrast
Comparison: None.

CLINICAL DATA: Pain following fall

EXAM:
LEFT FOOT - COMPLETE 3+ VIEW

[foot ap]
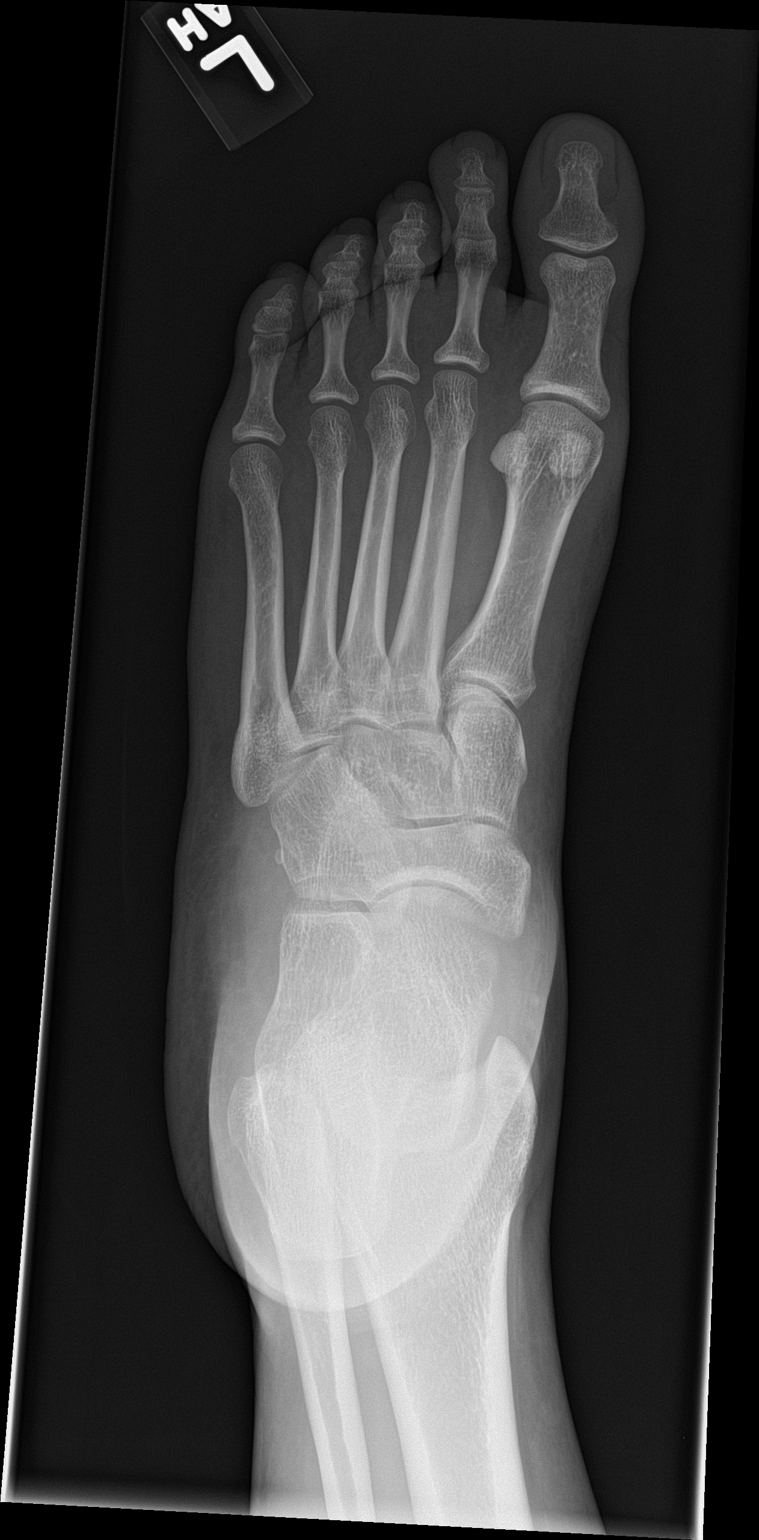

[foot obl]
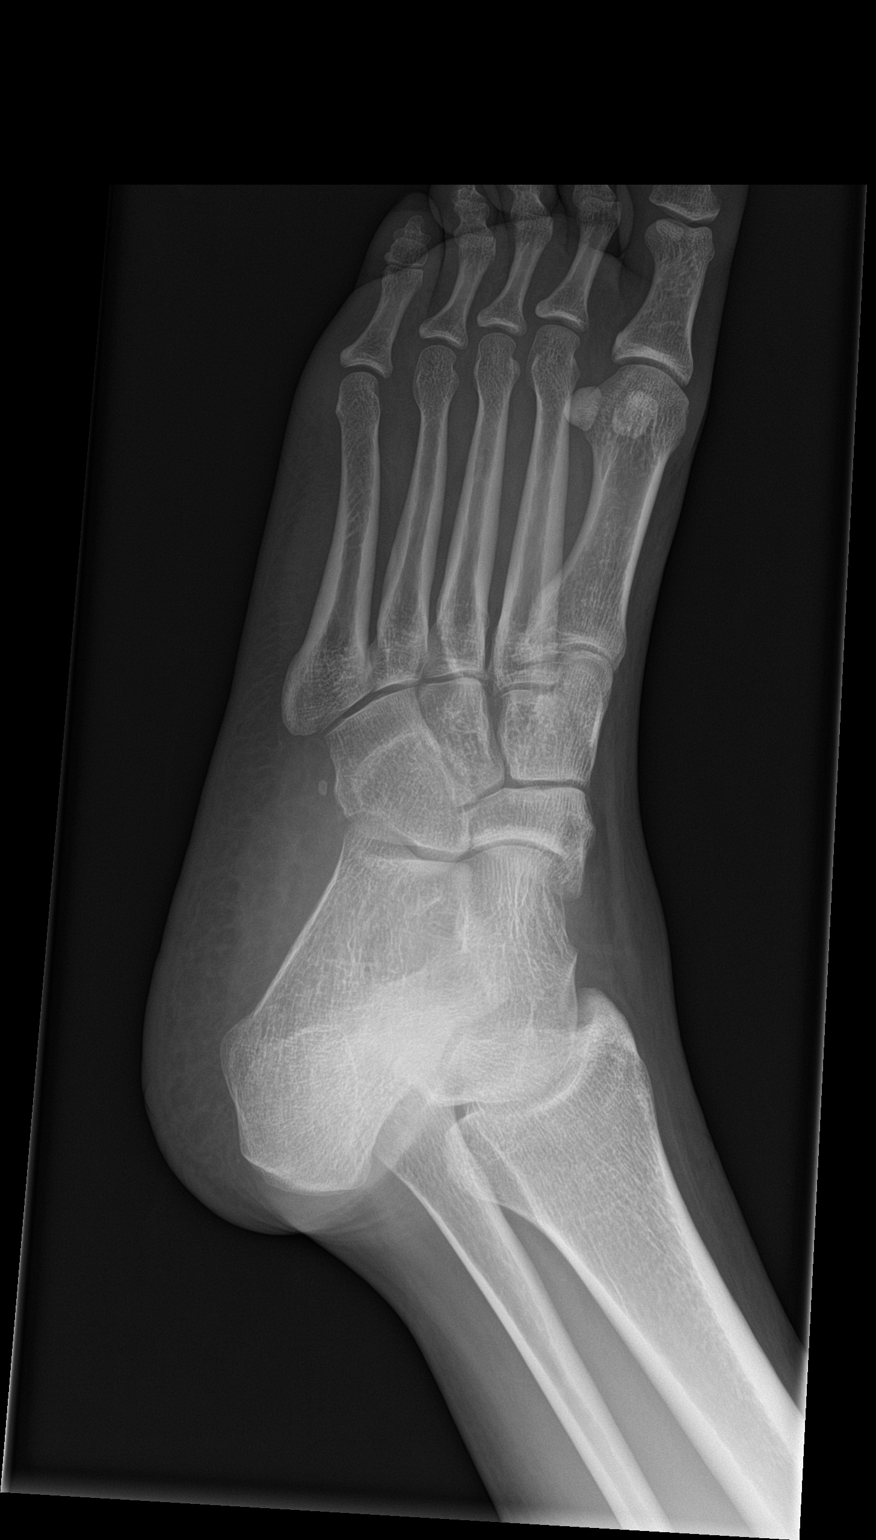

[foot lat]
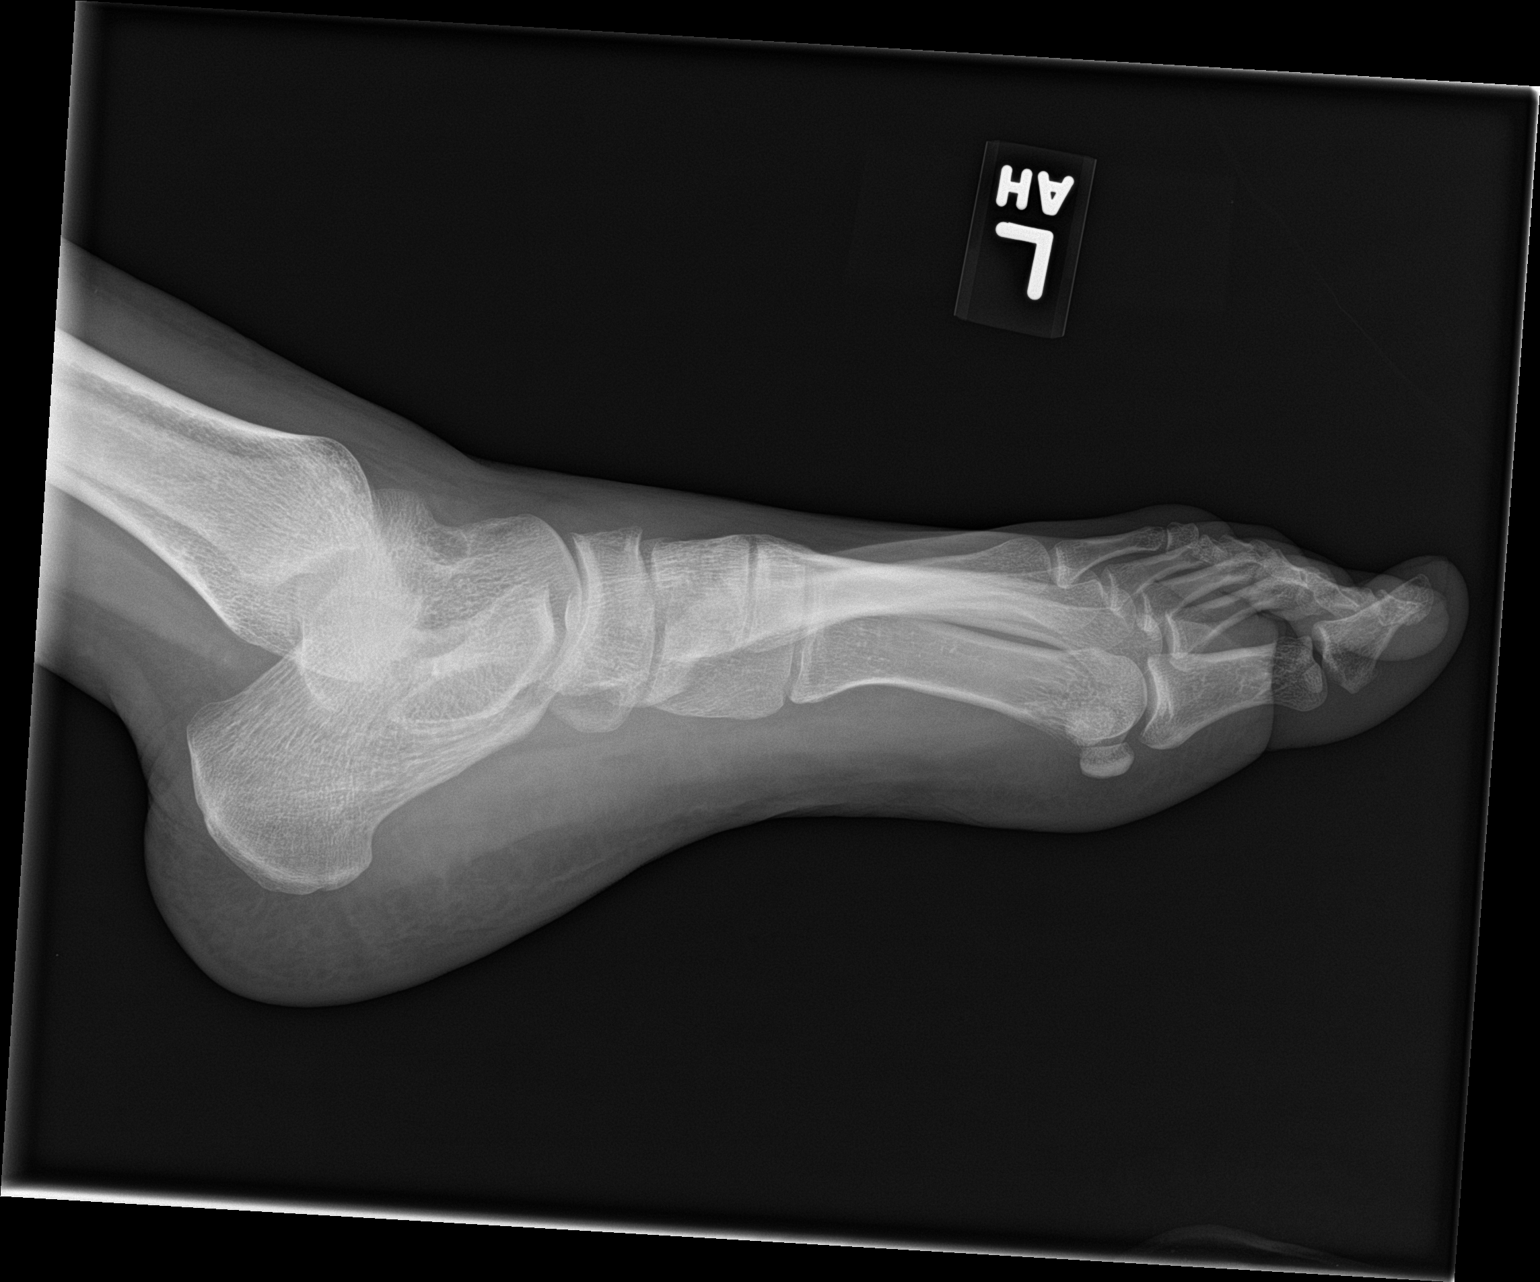

[3 of 3 positions shown; findings below may reference images not displayed]

FINDINGS: Frontal, oblique, and lateral views were obtained. There is no
demonstrable fracture or dislocation. Joint spaces appear intact. No
erosive change.
IMPRESSION: No fracture or dislocation.  Joint spaces appear intact.
